# Patient Record
Sex: Male | Born: 1960 | Race: Black or African American | Hispanic: No
Health system: Southern US, Community
[De-identification: ages and names within clinical notes are randomized; demographics above are authoritative.]

---

## 2006-01-06 ENCOUNTER — Encounter: Admission: RE | Admit: 2006-01-06 | Discharge: 2006-01-06 | Payer: Self-pay | Admitting: Occupational Medicine

## 2007-04-08 IMAGING — CR DG CHEST 2V
2 series · 2 of 2 positions shown · non-contrast
Comparison: None.

CLINICAL DATA: Preemployment physical.
 CHEST - 2 VIEW:

[view not recorded (1 of 2)]
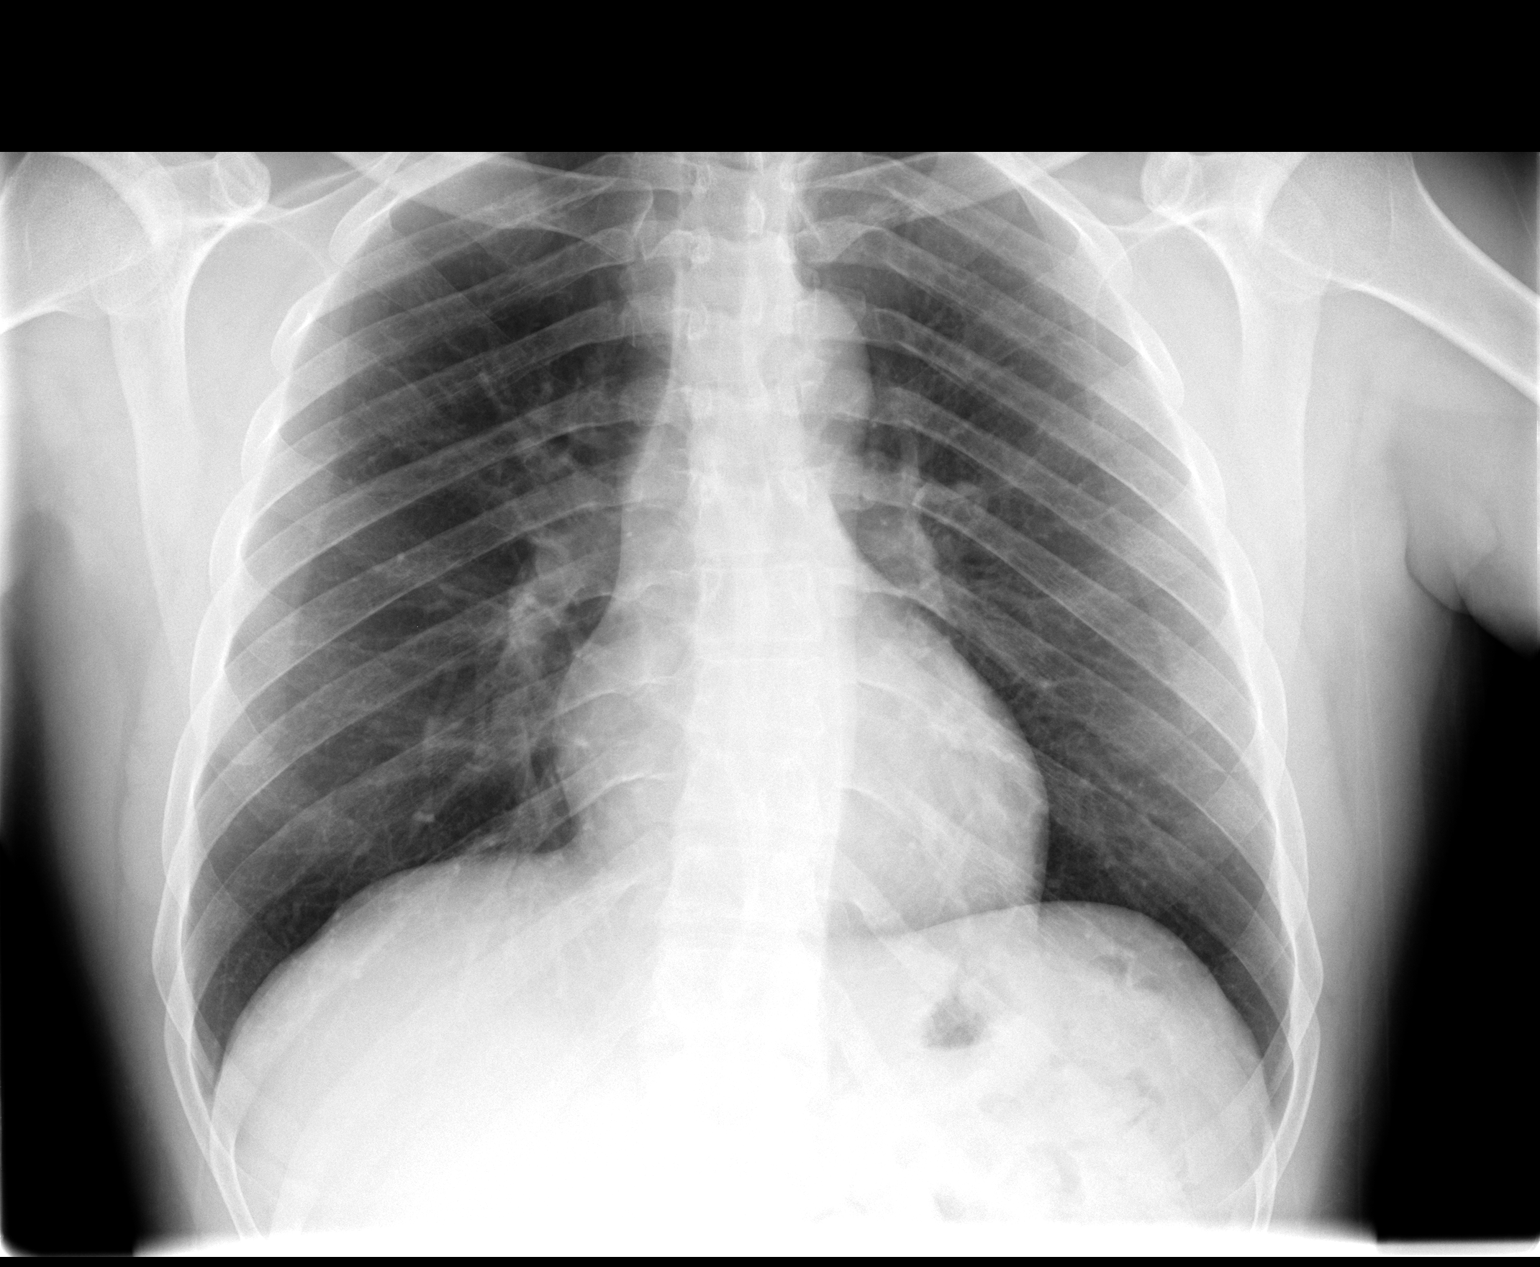

[view not recorded (2 of 2)]
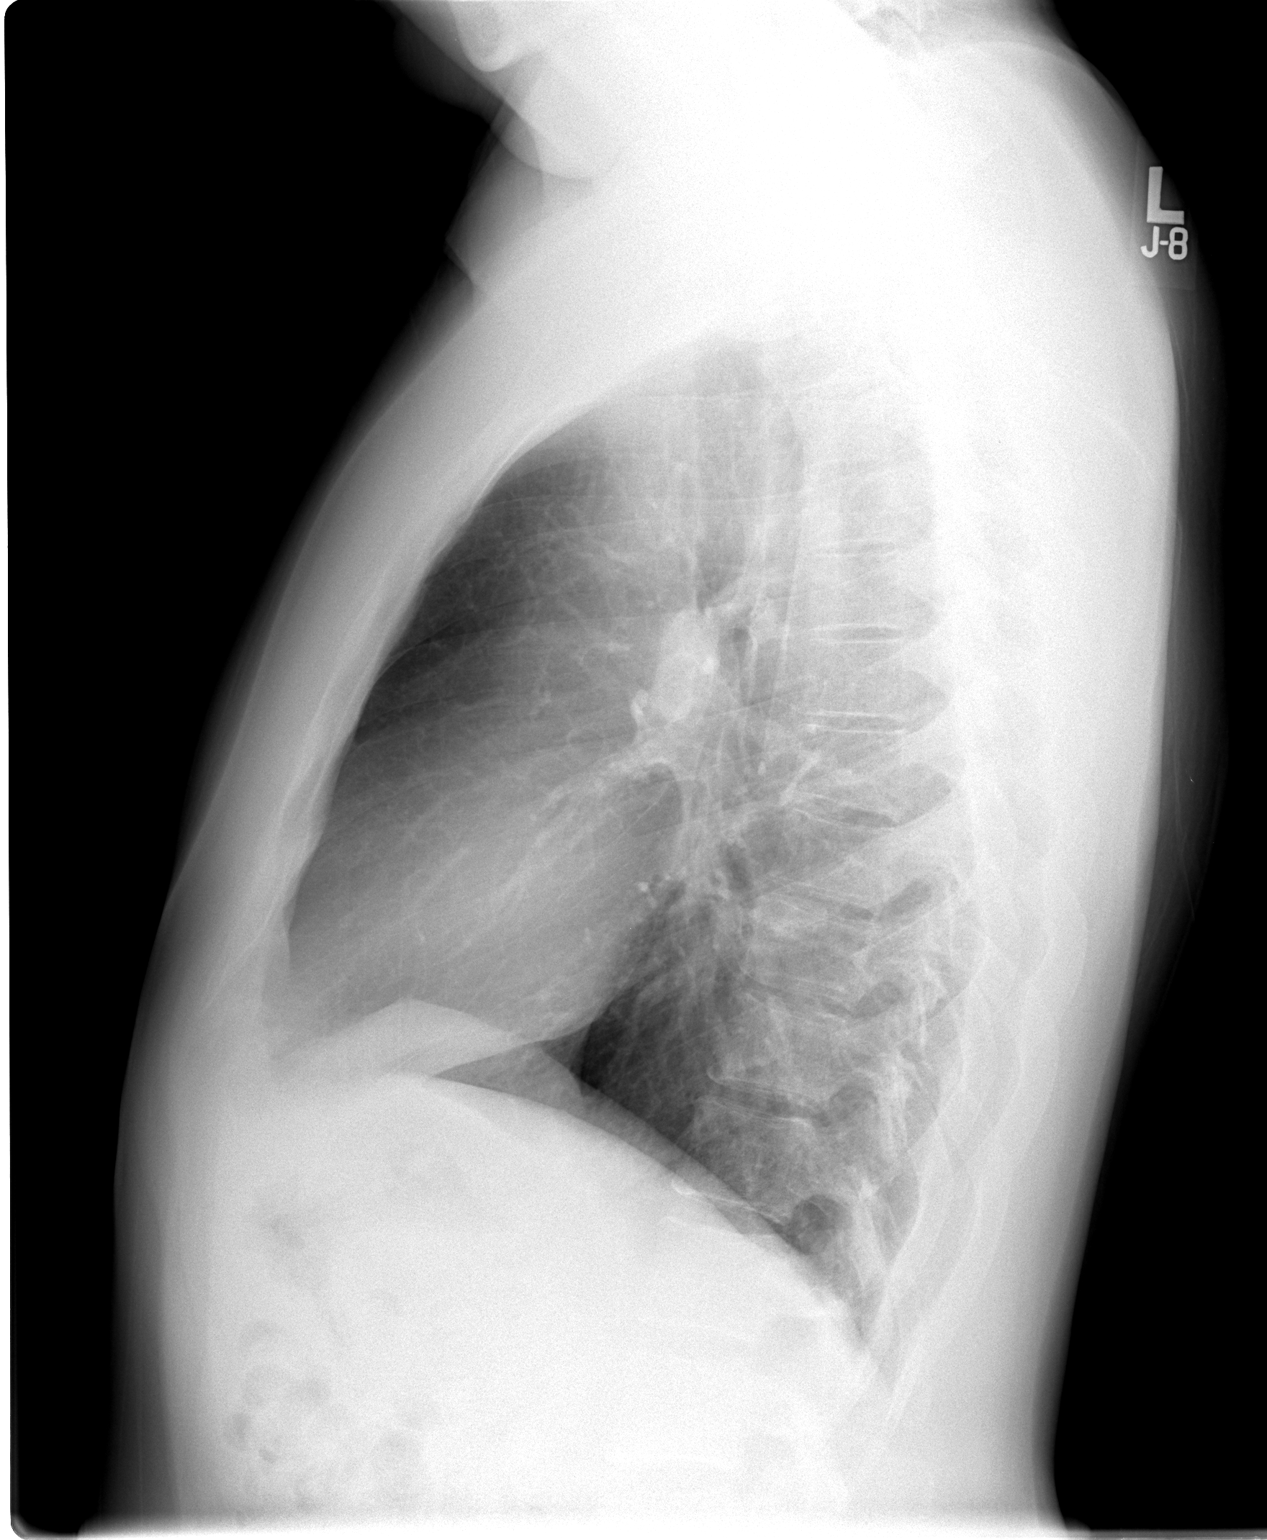

[2 of 2 positions shown; findings below may reference images not displayed]

FINDINGS: The trachea is midline.  Heart size is normal.  Lungs are clear.  No pleural fluid.
IMPRESSION: No acute findings.

## 2013-03-26 ENCOUNTER — Ambulatory Visit: Payer: BC Managed Care – PPO | Attending: Orthopedic Surgery | Admitting: Physical Therapy

## 2013-03-26 DIAGNOSIS — IMO0001 Reserved for inherently not codable concepts without codable children: Secondary | ICD-10-CM | POA: Insufficient documentation

## 2013-03-26 DIAGNOSIS — M25549 Pain in joints of unspecified hand: Secondary | ICD-10-CM | POA: Insufficient documentation

## 2013-03-31 ENCOUNTER — Ambulatory Visit: Payer: BC Managed Care – PPO | Admitting: Rehabilitation

## 2013-04-05 ENCOUNTER — Ambulatory Visit: Payer: BC Managed Care – PPO | Admitting: Rehabilitation

## 2013-04-07 ENCOUNTER — Ambulatory Visit: Payer: BC Managed Care – PPO | Admitting: Rehabilitation

## 2013-04-09 ENCOUNTER — Ambulatory Visit: Payer: BC Managed Care – PPO | Admitting: Rehabilitation

## 2013-04-14 ENCOUNTER — Ambulatory Visit: Payer: BC Managed Care – PPO | Admitting: Rehabilitation

## 2013-04-20 ENCOUNTER — Ambulatory Visit: Payer: BC Managed Care – PPO | Admitting: Rehabilitation

## 2013-04-23 ENCOUNTER — Ambulatory Visit: Payer: BC Managed Care – PPO | Attending: Sports Medicine | Admitting: Rehabilitation

## 2013-04-23 DIAGNOSIS — M25549 Pain in joints of unspecified hand: Secondary | ICD-10-CM | POA: Insufficient documentation

## 2013-04-23 DIAGNOSIS — IMO0001 Reserved for inherently not codable concepts without codable children: Secondary | ICD-10-CM | POA: Insufficient documentation

## 2013-05-06 ENCOUNTER — Ambulatory Visit: Payer: BC Managed Care – PPO | Admitting: Rehabilitation

## 2013-05-17 ENCOUNTER — Ambulatory Visit: Payer: BC Managed Care – PPO | Attending: Sports Medicine | Admitting: Rehabilitation

## 2013-05-17 DIAGNOSIS — IMO0001 Reserved for inherently not codable concepts without codable children: Secondary | ICD-10-CM | POA: Insufficient documentation

## 2013-05-17 DIAGNOSIS — M25549 Pain in joints of unspecified hand: Secondary | ICD-10-CM | POA: Insufficient documentation

## 2013-05-21 ENCOUNTER — Ambulatory Visit: Payer: BC Managed Care – PPO | Admitting: Rehabilitation
